# Patient Record
Sex: Male | Born: 1990 | Race: Black or African American | Hispanic: No | Marital: Single | Smoking: Never smoker
Health system: Southern US, Community
[De-identification: ages and names within clinical notes are randomized; demographics above are authoritative.]

## PROBLEM LIST (undated history)

## (undated) DIAGNOSIS — N471 Phimosis: Secondary | ICD-10-CM

## (undated) DIAGNOSIS — Z973 Presence of spectacles and contact lenses: Secondary | ICD-10-CM

---
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
## 2010-07-02 NOTE — Op Note (Signed)
Robert Garza, Robert Garza                ACCOUNT NO.:  [id number]   MEDICAL RECORD NO.:  [id number]          PATIENT TYPE:  AMB   LOCATION:  DSC                          FACILITY:  [facility]   PHYSICIAN:  Loreta Ave, M.D. DATE OF BIRTH:  February 27, 1990   DATE OF PROCEDURE:  09/10/2006  DATE OF DISCHARGE:                               OPERATIVE REPORT   PREOPERATIVE DIAGNOSIS:  Right shoulder anterior instability, recurrent  anterior dislocations, Bankart lesion.   POSTOPERATIVE DIAGNOSIS:  Right shoulder anterior instability, recurrent  anterior dislocations, with anterior bony Bankart lesion, anterior  superior anterior inferior labral tears.  Hill-Sachs lesion.   PROCEDURE:  Right shoulder exam under anesthesia, arthroscopy,  debridement of labrum and bony fragments anterior glenoid.  Bankart  reconstruction utilizing bioabsorbable anchors x4 with Arthrex knotless  anchors and Fiberlink suture.   SURGEON:  Loreta Ave, M.D.   ASSISTANT:  Zonia Kief, PA, PA present throughout the entire case.   ANESTHESIA:  General.   BLOOD LOSS:  Minimal.   SPECIMENS:  None.   CULTURES:  None.   COMPLICATIONS:  None.   DRESSING:  Soft compressive with shoulder immobilizer.   PROCEDURE:  The patient brought to the operating room, placed on  operating table supine position.  After adequate anesthesia been  obtained, both shoulders examined.  On the right he has got anterior  subluxation, anterior instability, increased external rotation.  Otherwise stable.  Placed in beach-chair position on shoulder  positioner, prepped and draped in usual sterile fashion.  Two portals,  one posterior, one anterior.  Shoulder entered with blunt obturator.  Arthroscope introduced, the shoulder distended and inspected.  Obvious  Bankart lesion from 12 to 6 o'clock position.  Acute on chronic.  Complex tear of the labrum above and below debrided.  Bony fragments off  the front of the glenoid at the 4  o'clock position debrided.  The  capsular labral interface was then mobilized and captured with four  Fiberlink sutures utilizing a lasso technique to place the Fiberlink  sutures through the capsule at the capsular labral interface.  Firmly  fixed to the glenoid after advancing this up to the glenoid.  The  glenoid had been roughened with a bur to good bleeding bone before this.  Utilizing the Fiberlink suture and knotless Arthrex anchors which were  pre drilled, the capsular labral interface was firmly fixed to the front  of the glenoid at the 1 o'clock, 2:30, 4 o'clock and 5:30 positions.  Sutures were cut flush with glenoid.  After confirmation of a nice solid  reconstruction, entire shoulder examined.  Remaining structures  including biceps tendon biceps anchor rotator cuff all looked good.  Hill-Sachs lesion was seen on the back of the humerus but no other loose  bodies.  Instruments and fluid removed.  During reconstruction the  anterior portal had been enlarged for a large cannula.  Both portals  were  then closed with nylon.  Margins were injected Marcaine as was shoulder.  Sterile compressive dressing applied.  Shoulder mobilizer applied.  Anesthesia reversed.  Brought to the recovery room.  Tolerated surgery  well.  No complications.      Loreta Ave, M.D.  Electronically Signed     DFM/MEDQ  D:  09/10/2006  T:  09/11/2006  Job:  [id number]

## 2010-12-02 LAB — POCT HEMOGLOBIN-HEMACUE: Hemoglobin: 16.1 — ABNORMAL HIGH

## 2013-05-25 ENCOUNTER — Other Ambulatory Visit: Payer: Self-pay | Admitting: Urology

## 2013-06-09 ENCOUNTER — Encounter (HOSPITAL_BASED_OUTPATIENT_CLINIC_OR_DEPARTMENT_OTHER): Payer: Self-pay | Admitting: *Deleted

## 2013-06-09 NOTE — Progress Notes (Signed)
NPO AFTER MN.  ARRIVE AT 0715.  NEEDS HG. 

## 2013-06-10 ENCOUNTER — Encounter (HOSPITAL_BASED_OUTPATIENT_CLINIC_OR_DEPARTMENT_OTHER): Payer: Self-pay

## 2013-06-10 ENCOUNTER — Encounter (HOSPITAL_BASED_OUTPATIENT_CLINIC_OR_DEPARTMENT_OTHER): Payer: Managed Care, Other (non HMO) | Admitting: Certified Registered"

## 2013-06-10 ENCOUNTER — Encounter (HOSPITAL_BASED_OUTPATIENT_CLINIC_OR_DEPARTMENT_OTHER)
Admission: RE | Disposition: A | Discharge status: Home / Self Care | Payer: Self-pay | Source: Ambulatory Visit | Attending: Urology

## 2013-06-10 ENCOUNTER — Ambulatory Visit (HOSPITAL_BASED_OUTPATIENT_CLINIC_OR_DEPARTMENT_OTHER): Payer: Managed Care, Other (non HMO) | Admitting: Certified Registered"

## 2013-06-10 ENCOUNTER — Ambulatory Visit (HOSPITAL_BASED_OUTPATIENT_CLINIC_OR_DEPARTMENT_OTHER)
Admission: RE | Admit: 2013-06-10 | Discharge: 2013-06-10 | Disposition: A | Discharge status: Home / Self Care | Payer: Managed Care, Other (non HMO) | Source: Ambulatory Visit | Attending: Urology | Admitting: Urology

## 2013-06-10 DIAGNOSIS — Z789 Other specified health status: Secondary | ICD-10-CM | POA: Insufficient documentation

## 2013-06-10 DIAGNOSIS — Z79899 Other long term (current) drug therapy: Secondary | ICD-10-CM | POA: Insufficient documentation

## 2013-06-10 DIAGNOSIS — N476 Balanoposthitis: Secondary | ICD-10-CM | POA: Insufficient documentation

## 2013-06-10 DIAGNOSIS — N471 Phimosis: Principal | ICD-10-CM | POA: Insufficient documentation

## 2013-06-10 DIAGNOSIS — N478 Other disorders of prepuce: Secondary | ICD-10-CM | POA: Insufficient documentation

## 2013-06-10 HISTORY — DX: Phimosis: N47.1

## 2013-06-10 HISTORY — PX: CIRCUMCISION: SHX1350

## 2013-06-10 HISTORY — DX: Presence of spectacles and contact lenses: Z97.3

## 2013-06-10 LAB — POCT HEMOGLOBIN-HEMACUE: Hemoglobin: 15.1 g/dL (ref 13.0–17.0)

## 2013-06-10 SURGERY — CIRCUMCISION, ADULT
Anesthesia: General | Site: Penis

## 2013-06-10 MED ORDER — GLYCOPYRROLATE 0.2 MG/ML IJ SOLN
INTRAMUSCULAR | Status: DC | PRN
  Administered 2013-06-10: 0.4 mg via INTRAVENOUS

## 2013-06-10 MED ORDER — MIDAZOLAM HCL 5 MG/5ML IJ SOLN
INTRAMUSCULAR | Status: DC | PRN
  Administered 2013-06-10: 2 mg via INTRAVENOUS

## 2013-06-10 MED ORDER — LACTATED RINGERS IV SOLN
INTRAVENOUS | Status: DC
  Filled 2013-06-10: qty 1000

## 2013-06-10 MED ORDER — LIDOCAINE HCL (CARDIAC) 20 MG/ML IV SOLN
INTRAVENOUS | Status: DC | PRN
  Administered 2013-06-10: 60 mg via INTRAVENOUS

## 2013-06-10 MED ORDER — FENTANYL CITRATE 0.05 MG/ML IJ SOLN
INTRAMUSCULAR | Status: DC | PRN
Start: 2013-06-10 — End: 2013-06-10
  Administered 2013-06-10 (×2): 25 ug via INTRAVENOUS
  Administered 2013-06-10: 50 ug via INTRAVENOUS

## 2013-06-10 MED ORDER — DEXAMETHASONE SODIUM PHOSPHATE 4 MG/ML IJ SOLN
INTRAMUSCULAR | Status: DC | PRN
  Administered 2013-06-10: 10 mg via INTRAVENOUS

## 2013-06-10 MED ORDER — MEPERIDINE HCL 25 MG/ML IJ SOLN
6.2500 mg | INTRAMUSCULAR | Status: DC | PRN
  Filled 2013-06-10: qty 1

## 2013-06-10 MED ORDER — MIDAZOLAM HCL 2 MG/2ML IJ SOLN
INTRAMUSCULAR | Status: AC
  Filled 2013-06-10: qty 2

## 2013-06-10 MED ORDER — LACTATED RINGERS IV SOLN
INTRAVENOUS | Status: DC
  Administered 2013-06-10: 08:00:00 via INTRAVENOUS
  Filled 2013-06-10: qty 1000

## 2013-06-10 MED ORDER — FENTANYL CITRATE 0.05 MG/ML IJ SOLN
25.0000 ug | INTRAMUSCULAR | Status: DC | PRN
  Filled 2013-06-10: qty 1

## 2013-06-10 MED ORDER — MINERAL OIL LIGHT 100 % EX OIL
TOPICAL_OIL | CUTANEOUS | Status: DC | PRN
  Administered 2013-06-10: 1 via TOPICAL

## 2013-06-10 MED ORDER — FENTANYL CITRATE 0.05 MG/ML IJ SOLN
INTRAMUSCULAR | Status: AC
  Filled 2013-06-10: qty 4

## 2013-06-10 MED ORDER — ONDANSETRON HCL 4 MG/2ML IJ SOLN
INTRAMUSCULAR | Status: DC | PRN
  Administered 2013-06-10: 4 mg via INTRAVENOUS

## 2013-06-10 MED ORDER — BUPIVACAINE HCL (PF) 0.25 % IJ SOLN
INTRAMUSCULAR | Status: DC | PRN
  Administered 2013-06-10: 10 mL

## 2013-06-10 MED ORDER — PROMETHAZINE HCL 25 MG/ML IJ SOLN
6.2500 mg | INTRAMUSCULAR | Status: DC | PRN
  Filled 2013-06-10: qty 1

## 2013-06-10 MED ORDER — PROPOFOL 10 MG/ML IV BOLUS
INTRAVENOUS | Status: DC | PRN
  Administered 2013-06-10: 200 mg via INTRAVENOUS

## 2013-06-10 SURGICAL SUPPLY — 28 items
BANDAGE CO FLEX L/F 1IN X 5YD (GAUZE/BANDAGES/DRESSINGS) ×2 IMPLANT
BANDAGE CO FLEX L/F 2IN X 5YD (GAUZE/BANDAGES/DRESSINGS) ×2 IMPLANT
BLADE SURG 15 STRL LF DISP TIS (BLADE) ×1 IMPLANT
BLADE SURG 15 STRL SS (BLADE) ×3
BNDG CONFORM 2 STRL LF (GAUZE/BANDAGES/DRESSINGS) ×4 IMPLANT
CLOTH BEACON ORANGE TIMEOUT ST (SAFETY) ×3 IMPLANT
COVER MAYO STAND STRL (DRAPES) ×3 IMPLANT
COVER TABLE BACK 60X90 (DRAPES) ×3 IMPLANT
DRAPE PED LAPAROTOMY (DRAPES) ×3 IMPLANT
ELECT REM PT RETURN 9FT ADLT (ELECTROSURGICAL) ×3
ELECTRODE REM PT RTRN 9FT ADLT (ELECTROSURGICAL) ×1 IMPLANT
GAUZE VASELINE 1X8 (GAUZE/BANDAGES/DRESSINGS) ×4 IMPLANT
GLOVE BIO SURGEON STRL SZ7 (GLOVE) ×3 IMPLANT
GLOVE INDICATOR 7.5 STRL GRN (GLOVE) ×4 IMPLANT
GLOVE SURG SS PI 7.5 STRL IVOR (GLOVE) ×2 IMPLANT
GOWN PREVENTION PLUS LG XLONG (DISPOSABLE) ×1 IMPLANT
GOWN STRL REUS W/TWL LRG LVL3 (GOWN DISPOSABLE) ×2 IMPLANT
GOWN STRL REUS W/TWL XL LVL3 (GOWN DISPOSABLE) ×2 IMPLANT
NDL HYPO 25X1 1.5 SAFETY (NEEDLE) ×1 IMPLANT
NEEDLE HYPO 25X1 1.5 SAFETY (NEEDLE) ×3 IMPLANT
PACK BASIN DAY SURGERY FS (CUSTOM PROCEDURE TRAY) ×3 IMPLANT
PENCIL BUTTON HOLSTER BLD 10FT (ELECTRODE) ×3 IMPLANT
SUT CHROMIC 4 0 P 3 18 (SUTURE) ×4 IMPLANT
SUT CHROMIC 5 0 RB 1 27 (SUTURE) IMPLANT
SYR CONTROL 10ML LL (SYRINGE) ×3 IMPLANT
TOWEL OR 17X24 6PK STRL BLUE (TOWEL DISPOSABLE) ×6 IMPLANT
TRAY DSU PREP LF (CUSTOM PROCEDURE TRAY) ×3 IMPLANT
WATER STERILE IRR 500ML POUR (IV SOLUTION) ×3 IMPLANT

## 2013-06-10 NOTE — Progress Notes (Signed)
Delay in leaving patient had to wait until mother was able to pick him up

## 2013-06-10 NOTE — Transfer of Care (Signed)
Immediate Anesthesia Transfer of Care Note  Patient: Robert FeltySteven Q Hillery  Procedure(s) Performed: Procedure(s) (LRB): CIRCUMCISION ADULT (N/A)  Patient Location: PACU  Anesthesia Type: General  Level of Consciousness: awake, oriented, sedated and patient cooperative  Airway & Oxygen Therapy: Patient Spontanous Breathing and Patient connected to face mask oxygen  Post-op Assessment: Report given to PACU RN and Post -op Vital signs reviewed and stable  Post vital signs: Reviewed and stable  Complications: No apparent anesthesia complications

## 2013-06-10 NOTE — Anesthesia Procedure Notes (Signed)
Procedure Name: LMA Insertion Date/Time: 06/10/2013 8:55 AM Performed by: Renella CunasHAZEL, Theodoros Stjames D Pre-anesthesia Checklist: Patient identified, Emergency Drugs available, Suction available and Patient being monitored Patient Re-evaluated:Patient Re-evaluated prior to inductionOxygen Delivery Method: Circle System Utilized Preoxygenation: Pre-oxygenation with 100% oxygen Intubation Type: IV induction Ventilation: Mask ventilation without difficulty LMA: LMA inserted LMA Size: 4.0 Number of attempts: 1 Airway Equipment and Method: bite block Placement Confirmation: positive ETCO2 Tube secured with: Tape Dental Injury: Teeth and Oropharynx as per pre-operative assessment

## 2013-06-10 NOTE — Anesthesia Preprocedure Evaluation (Signed)
Anesthesia Evaluation  Patient identified by MRN, date of birth, ID band Patient awake    Reviewed: Allergy & Precautions, H&P , NPO status , Patient's Chart, lab work & pertinent test results  Airway Mallampati: II  TM Distance: >3 FB Neck ROM: full    Dental no notable dental hx.    Pulmonary neg pulmonary ROS,  breath sounds clear to auscultation  Pulmonary exam normal       Cardiovascular Exercise Tolerance: Good negative cardio ROS  Rhythm:regular Rate:Normal     Neuro/Psych negative neurological ROS  negative psych ROS   GI/Hepatic negative GI ROS, Neg liver ROS,   Endo/Other  negative endocrine ROS  Renal/GU negative Renal ROS  negative genitourinary   Musculoskeletal   Abdominal   Peds  Hematology negative hematology ROS (+)   Anesthesia Other Findings   Reproductive/Obstetrics negative OB ROS                             Anesthesia Physical Anesthesia Plan  ASA: I  Anesthesia Plan: General LMA   Post-op Pain Management:    Induction:   Airway Management Planned:   Additional Equipment:   Intra-op Plan:   Post-operative Plan:   Informed Consent: I have reviewed the patients History and Physical, chart, labs and discussed the procedure including the risks, benefits and alternatives for the proposed anesthesia with the patient or authorized representative who has indicated his/her understanding and acceptance.   Dental Advisory Given  Plan Discussed with: CRNA  Anesthesia Plan Comments:         Anesthesia Quick Evaluation  

## 2013-06-10 NOTE — Discharge Instructions (Signed)
°  Post Anesthesia Home Care Instructions  Activity: Get plenty of rest for the remainder of the day. A responsible adult should stay with you for 24 hours following the procedure.  For the next 24 hours, DO NOT: -Drive a car -Advertising copywriterperate machinery -Drink alcoholic beverages -Take any medication unless instructed by your physician -Make any legal decisions or sign important papers.  Meals: Start with liquid foods such as gelatin or soup. Progress to regular foods as tolerated. Avoid greasy, spicy, heavy foods. If nausea and/or vomiting occur, drink only clear liquids until the nausea and/or vomiting subsides. Call your physician if vomiting continues.  Special Instructions/Symptoms: Your throat may feel dry or sore from the anesthesia or the breathing tube placed in your throat during surgery. If this causes discomfort, gargle with warm salt water. The discomfort should disappear within 24 hours.   Circumcision-Home Care Instructions  The following instructions have been prepared to help you care for yourself upon your return home today.   Wound Care & Hygiene:   You may apply ice to the penis.  This may help to decrease swelling.  Remove the dressing tomorrow.  If the dressing falls off before then, leave it off.  You may shower or bathe in 48 hours  Gently wash the penis with soap and water.  The stitches do not need to be removed.  Activity:  Do not drive or operate any equipment today.  The effects of anesthesia are still present, drowsiness may result.  Rest today, not necessarily flat bed rest, just take it easy.  You may resume your normal activity in one to two days or as indicated by your physician.  Sexual Activity:  Erection and sexual relations should be avoided for *2 weeks.  Return to Work:  One to two days or as indicated by your physician ***.  Diet:  Drink liquids or eat a very light diet this evening.  You may resume a regular diet tomorrow.  General Expectations of  your surgery:   You may have a small amount of bleeding  The penis will be swollen and bruised for approximately one week  You may wake during the night with an erection, usually this is caused by having a full bladder so you should try to urinate (pass your water) to relieve the erection or apply ice to the penis  Unexpected Observations - Call your doctor if these occur!  Persistent or heavy bleeding  Temperature of 101 degrees or more  Severe pain not relieved by medication  Return to Office *** .  Call to schedule your appointment ***.  Additional Instructions:  ***  Patient's Signature:__________________________________________________  Physician's Signature:___________________ Nurse's Signature_______________

## 2013-06-10 NOTE — Op Note (Signed)
Robert Garza is a 23 y.o.   06/10/2013  General  Pre-op diagnosis: Phimosis  Postop diagnosis: Same  Procedure done: Circumcision  Surgeon: Wendie SimmerMarc H. Tyion Boylen  Anesthesia: General  Indication: Patient is a 23 years old male who has been complaining of difficulty retracting his foreskin for several years. He also has been having several cuts on his penis. He would like to have a circumcision. He is scheduled today for the procedure.  Procedure: Patient was identified by his wrist band and proper timeout was taken.  Under general anesthesia he was prepped and draped and placed in the supine position. A penile block was done with 0.25% Marcaine.. 2 circumferential incisions were made on the foreskin and the foreskin in between those 2 incisions was excised. Hemostasis was secured with electrocautery. Skin approximation was then done with #4-0 chromic. There was no evidence of bleeding at the end of the procedure. Sterile dressing was then applied to the penis.  EBL: Minimal  Needles, sponges count: Correct on 2 counts.  Patient tolerated the procedure well and left the OR in satisfactory condition to postanesthesia care unit.

## 2013-06-10 NOTE — H&P (Signed)
  History of Present Illness Robert Garza presents today complaining of difficulty retracting his foreskin for several years. He also has been having several cuts on his penis. He would like to have a circumcision.   Surgical History Problems  1. History of Shoulder Surgery  Current Meds 1. Allegra CAPS;  Therapy: (Recorded:07Apr2015) to Recorded  Allergies Medication  1. No Known Drug Allergies  Family History Problems  1. No pertinent family history : Mother  Social History Problems    Alcohol use (V49.89)   Caffeine use (V49.89)   Father alive and healthy   5845   Mother alive and healthy   7349   Never a smoker   Occupation   Arts administratorparts cleaner   Single  Review of Systems Genitourinary, constitutional, skin, eye, otolaryngeal, hematologic/lymphatic, cardiovascular, pulmonary, endocrine, musculoskeletal, gastrointestinal, neurological and psychiatric system(s) were reviewed and pertinent findings if present are noted.  ENT: sinus problems.    Vitals Vital Signs [Data Includes: Last 1 Day]  Recorded: 07Apr2015 04:22PM  Height: 5 ft 7 in Weight: 185 lb  BMI Calculated: 28.98 BSA Calculated: 1.96 Blood Pressure: 133 / 86 Heart Rate: 53 Respiration: 18  Physical Exam Constitutional: Well nourished and well developed . No acute distress.  ENT:. The ears and nose are normal in appearance.  Neck: The appearance of the neck is normal and no neck mass is present.  Pulmonary: No respiratory distress and normal respiratory rhythm and effort.  Cardiovascular: Heart rate and rhythm are normal . No peripheral edema.  Abdomen: The abdomen is soft and nontender. No masses are palpated. No CVA tenderness. No hernias are palpable. No hepatosplenomegaly noted.  Genitourinary: Examination of the penis demonstrates no discharge, no masses, no lesions and a normal meatus. The penis is uncircumcised. The scrotum is normal in appearance and without lesions. Examination of the right  scrotum demonstrates no hydrocele. Examination of the left scrotum demostrates no hydrocele. The right epididymis is palpably normal and non-tender. The left epididymis is palpably normal and non-tender. The right spermatic cord is palpably normal. The left spermatic cord is palpably normal. The right testis is palpably normal, non-tender and without masses. The left testis is normal, non-tender and without masses.  Lymphatics: The femoral and inguinal nodes are not enlarged or tender.  Skin: Normal skin turgor, no visible rash and no visible skin lesions.  Neuro/Psych:. Mood and affect are appropriate.    Assessment Assessed  1. Balanitis (607.1) 2. Phimosis (605)  Plan Circumcision. The procedure, risks, benefits were discussed with the patient. The risks include but are not limited to hemorrhage, infection, penile hematoma, skin loss. He understands and wishes to proceed.

## 2013-06-11 NOTE — Anesthesia Postprocedure Evaluation (Signed)
  Anesthesia Post-op Note  Patient: Robert FeltySteven Q Dupuis  Procedure(s) Performed: Procedure(s) (LRB): CIRCUMCISION ADULT (N/A)  Patient Location: PACU  Anesthesia Type: General  Level of Consciousness: awake and alert   Airway and Oxygen Therapy: Patient Spontanous Breathing  Post-op Pain: mild  Post-op Assessment: Post-op Vital signs reviewed, Patient's Cardiovascular Status Stable, Respiratory Function Stable, Patent Airway and No signs of Nausea or vomiting  Last Vitals:  Filed Vitals:   06/10/13 1140  BP: 140/86  Pulse: 52  Temp: 36.2 C  Resp: 18    Post-op Vital Signs: stable   Complications: No apparent anesthesia complications

## 2013-06-13 ENCOUNTER — Encounter (HOSPITAL_BASED_OUTPATIENT_CLINIC_OR_DEPARTMENT_OTHER): Payer: Self-pay | Admitting: Urology
